# Patient Record
Sex: Male | Born: 1995 | Race: White | Hispanic: No | Marital: Single | Smoking: Never smoker
Health system: Southern US, Community
[De-identification: ages and names within clinical notes are randomized; demographics above are authoritative.]

## PROBLEM LIST (undated history)

## (undated) HISTORY — PX: KNEE SURGERY: SHX244

## (undated) HISTORY — PX: TYMPANOSTOMY TUBE PLACEMENT: SHX32

## (undated) HISTORY — PX: DENTAL SURGERY: SHX609

## (undated) HISTORY — PX: HERNIA REPAIR: SHX51

## (undated) HISTORY — PX: ANKLE SURGERY: SHX546

---
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
## 2017-01-30 ENCOUNTER — Emergency Department (HOSPITAL_COMMUNITY): Payer: No Typology Code available for payment source

## 2017-01-30 ENCOUNTER — Encounter (HOSPITAL_COMMUNITY): Payer: Self-pay

## 2017-01-30 ENCOUNTER — Emergency Department (HOSPITAL_COMMUNITY)
Admission: EM | Admit: 2017-01-30 | Discharge: 2017-01-30 | Disposition: A | Discharge status: Home / Self Care | Payer: No Typology Code available for payment source | Attending: Emergency Medicine | Admitting: Emergency Medicine

## 2017-01-30 DIAGNOSIS — Z91040 Latex allergy status: Secondary | ICD-10-CM | POA: Diagnosis not present

## 2017-01-30 DIAGNOSIS — Y929 Unspecified place or not applicable: Secondary | ICD-10-CM | POA: Diagnosis not present

## 2017-01-30 DIAGNOSIS — S060X1A Concussion with loss of consciousness of 30 minutes or less, initial encounter: Secondary | ICD-10-CM | POA: Insufficient documentation

## 2017-01-30 DIAGNOSIS — R1084 Generalized abdominal pain: Secondary | ICD-10-CM | POA: Diagnosis not present

## 2017-01-30 DIAGNOSIS — Z79899 Other long term (current) drug therapy: Secondary | ICD-10-CM | POA: Insufficient documentation

## 2017-01-30 DIAGNOSIS — Y9389 Activity, other specified: Secondary | ICD-10-CM | POA: Diagnosis not present

## 2017-01-30 DIAGNOSIS — H5702 Anisocoria: Secondary | ICD-10-CM | POA: Diagnosis not present

## 2017-01-30 DIAGNOSIS — R112 Nausea with vomiting, unspecified: Secondary | ICD-10-CM | POA: Diagnosis present

## 2017-01-30 DIAGNOSIS — Z791 Long term (current) use of non-steroidal anti-inflammatories (NSAID): Secondary | ICD-10-CM | POA: Insufficient documentation

## 2017-01-30 DIAGNOSIS — Y999 Unspecified external cause status: Secondary | ICD-10-CM | POA: Diagnosis not present

## 2017-01-30 LAB — CBC
HEMATOCRIT: 41.1 % (ref 39.0–52.0)
Hemoglobin: 14.1 g/dL (ref 13.0–17.0)
MCH: 31.5 pg (ref 26.0–34.0)
MCHC: 34.3 g/dL (ref 30.0–36.0)
MCV: 91.9 fL (ref 78.0–100.0)
PLATELETS: 188 10*3/uL (ref 150–400)
RBC: 4.47 MIL/uL (ref 4.22–5.81)
RDW: 12.6 % (ref 11.5–15.5)
WBC: 5.3 10*3/uL (ref 4.0–10.5)

## 2017-01-30 LAB — COMPREHENSIVE METABOLIC PANEL
ALBUMIN: 4.1 g/dL (ref 3.5–5.0)
ALT: 16 U/L — AB (ref 17–63)
AST: 23 U/L (ref 15–41)
Alkaline Phosphatase: 81 U/L (ref 38–126)
Anion gap: 6 (ref 5–15)
BUN: 13 mg/dL (ref 6–20)
CHLORIDE: 106 mmol/L (ref 101–111)
CO2: 28 mmol/L (ref 22–32)
CREATININE: 1.09 mg/dL (ref 0.61–1.24)
Calcium: 9.6 mg/dL (ref 8.9–10.3)
GFR calc Af Amer: >60 mL/min (ref >60)
GFR calc non Af Amer: >60 mL/min (ref >60)
Glucose, Bld: 71 mg/dL (ref 65–99)
Potassium: 4.2 mmol/L (ref 3.5–5.1)
SODIUM: 140 mmol/L (ref 135–145)
Total Bilirubin: 3.5 mg/dL — ABNORMAL HIGH (ref 0.3–1.2)
Total Protein: 6.6 g/dL (ref 6.5–8.1)

## 2017-01-30 LAB — TYPE AND SCREEN
ABO/RH(D): A POS
Antibody Screen: NEGATIVE

## 2017-01-30 LAB — I-STAT CG4 LACTIC ACID, ED: Lactic Acid, Venous: 1.94 mmol/L — ABNORMAL HIGH (ref 0.5–1.9)

## 2017-01-30 LAB — ABO/RH: ABO/RH(D): A POS

## 2017-01-30 LAB — LIPASE, BLOOD: LIPASE: 19 U/L (ref 11–51)

## 2017-01-30 MED ORDER — IOPAMIDOL (ISOVUE-300) INJECTION 61%
INTRAVENOUS | Status: AC
  Administered 2017-01-30: 100 mL
  Filled 2017-01-30: qty 100

## 2017-01-30 MED ORDER — MORPHINE SULFATE (PF) 4 MG/ML IV SOLN
4.0000 mg | Freq: Once | INTRAVENOUS | Status: AC
  Administered 2017-01-30: 4 mg via INTRAVENOUS
  Filled 2017-01-30: qty 1

## 2017-01-30 MED ORDER — PROMETHAZINE HCL 25 MG PO TABS: 25.0000 mg | ORAL_TABLET | Freq: Four times a day (QID) | ORAL | 0 refills | Status: DC | PRN

## 2017-01-30 MED ORDER — FENTANYL CITRATE (PF) 100 MCG/2ML IJ SOLN
100.0000 ug | Freq: Once | INTRAMUSCULAR | Status: AC
  Administered 2017-01-30: 100 ug via INTRAVENOUS
  Filled 2017-01-30: qty 2

## 2017-01-30 MED ORDER — ONDANSETRON HCL 4 MG/2ML IJ SOLN
4.0000 mg | Freq: Once | INTRAMUSCULAR | Status: AC
  Administered 2017-01-30: 4 mg via INTRAVENOUS
  Filled 2017-01-30: qty 2

## 2017-01-30 MED ORDER — SODIUM CHLORIDE 0.9 % IV BOLUS (SEPSIS)
1000.0000 mL | Freq: Once | INTRAVENOUS | Status: AC
  Administered 2017-01-30: 1000 mL via INTRAVENOUS

## 2017-01-30 MED ORDER — KETOROLAC TROMETHAMINE 30 MG/ML IJ SOLN
30.0000 mg | Freq: Once | INTRAMUSCULAR | Status: AC
  Administered 2017-01-30: 30 mg via INTRAVENOUS
  Filled 2017-01-30: qty 1

## 2017-01-30 NOTE — ED Notes (Signed)
Pt had one episode of emesis while in the waiting room. Pupils unequal. Dr. Clarene DukeLittle informed and pt taken to trauma A for assessment.

## 2017-01-30 NOTE — ED Notes (Signed)
ED Provider at bedside. 

## 2017-01-30 NOTE — ED Notes (Signed)
Patient transported to CT 
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
## 2017-01-30 NOTE — ED Notes (Signed)
Fast exam negative by edp

## 2017-01-30 NOTE — Discharge Instructions (Signed)
Avoid screen time and have the reading until your symptoms resolved. Avoid contact sports. Take Tylenol and/or ibuprofen as needed for headaches. Return to ER if you have repeated vomiting, confusion, balance problems, extremity weakness, or sudden changes in mental status.
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
## 2018-07-21 ENCOUNTER — Other Ambulatory Visit: Payer: Self-pay

## 2018-07-21 ENCOUNTER — Encounter (HOSPITAL_COMMUNITY): Payer: Self-pay | Admitting: *Deleted

## 2018-07-21 ENCOUNTER — Emergency Department (HOSPITAL_COMMUNITY)
Admission: EM | Admit: 2018-07-21 | Discharge: 2018-07-21 | Disposition: A | Discharge status: Home / Self Care | Payer: No Typology Code available for payment source | Attending: Emergency Medicine | Admitting: Emergency Medicine

## 2018-07-21 DIAGNOSIS — F10929 Alcohol use, unspecified with intoxication, unspecified: Secondary | ICD-10-CM | POA: Insufficient documentation

## 2018-07-21 DIAGNOSIS — F10920 Alcohol use, unspecified with intoxication, uncomplicated: Secondary | ICD-10-CM

## 2018-07-21 DIAGNOSIS — Z91040 Latex allergy status: Secondary | ICD-10-CM | POA: Insufficient documentation
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
## 2018-07-21 NOTE — ED Notes (Signed)
Bed: [facility] Expected date:  Expected time:  Means of arrival:  Comments: 
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
## 2018-07-21 NOTE — ED Notes (Signed)
Pt ambulate down the hall and back to the hall bed done well

## 2018-07-21 NOTE — ED Notes (Signed)
Pt ambulated to restroom without difficulty

## 2018-07-21 NOTE — ED Notes (Signed)
Patient given ginger ale. Pt does not want anything to eat at this time.

## 2018-07-21 NOTE — Discharge Instructions (Signed)
You have been seen today for alcohol intoxication. Please read and follow all provided instructions.   1. Medications: usual home medications 2. Treatment: rest, drink plenty of fluids 3. Follow Up: Please follow up with your primary doctor in 2 days for discussion of your diagnoses and further evaluation after today's visit; if you do not have a primary care doctor use the resource guide provided to find one; Please return to the ER for any new or worsening symptoms. Please obtain all of your results from medical records or have your doctors office obtain the results - share them with your doctor - you should be seen at your doctors office. Call today to arrange your follow up.   Take medications as prescribed. Please review all of the medicines and only take them if you do not have an allergy to them. Return to the emergency room for worsening condition or new concerning symptoms. Follow up with your regular doctor. If you don't have a regular doctor use one of the numbers below to establish a primary care doctor.  Please be aware that if you are taking birth control pills, taking other prescriptions, ESPECIALLY ANTIBIOTICS may make the birth control ineffective - if this is the case, either do not engage in sexual activity or use alternative methods of birth control such as condoms until you have finished the medicine and your family doctor says it is OK to restart them. If you are on a blood thinner such as COUMADIN, be aware that any other medicine that you take may cause the coumadin to either work too much, or not enough - you should have your coumadin level rechecked in next 7 days if this is the case.  ?  It is also a possibility that you have an allergic reaction to any of the medicines that you have been prescribed - Everybody reacts differently to medications and while MOST people have no trouble with most medicines, you may have a reaction such as nausea, vomiting, rash, swelling, shortness of  breath. If this is the case, please stop taking the medicine immediately and contact your physician.  ?  You should return to the ER if you develop severe or worsening symptoms.   Emergency Department Resource Guide 1) Find a Doctor and Pay Out of Pocket Although you won't have to find out who is covered by your insurance plan, it is a good idea to ask around and get recommendations. You will then need to call the office and see if the doctor you have chosen will accept you as a new patient and what types of options they offer for patients who are self-pay. Some doctors offer discounts or will set up payment plans for their patients who do not have insurance, but you will need to ask so you aren't surprised when you get to your appointment.  2) Contact Your Local Health Department Not all health departments have doctors that can see patients for sick visits, but many do, so it is worth a call to see if yours does. If you don't know where your local health department is, you can check in your phone book. The CDC also has a tool to help you locate your state's health department, and many state websites also have listings of all of their local health departments.  3) Find a Walk-in Clinic If your illness is not likely to be very severe or complicated, you may want to try a walk in clinic. These are popping up all over  the country in pharmacies, drugstores, and shopping centers. They're usually staffed by nurse practitioners or physician assistants that have been trained to treat common illnesses and complaints. They're usually fairly quick and inexpensive. However, if you have serious medical issues or chronic medical problems, these are probably not your best option.  No Primary Care Doctor: Call [facility] at  [phone] - they can help you locate a primary care doctor that  accepts your insurance, provides certain services, etc. Physician Referral [phone]  Emergency Department  Resource Guide 1) Find a Doctor and Pay Out of Pocket Although you won't have to find out who is covered by your insurance plan, it is a good idea to ask around and get recommendations. You will then need to call the office and see if the doctor you have chosen will accept you as a new patient and what types of options they offer for patients who are self-pay. Some doctors offer discounts or will set up payment plans for their patients who do not have insurance, but you will need to ask so you aren't surprised when you get to your appointment.  2) Contact Your Local Health Department Not all health departments have doctors that can see patients for sick visits, but many do, so it is worth a call to see if yours does. If you don't know where your local health department is, you can check in your phone book. The CDC also has a tool to help you locate your state's health department, and many state websites also have listings of all of their local health departments.  3) Find a Walk-in Clinic If your illness is not likely to be very severe or complicated, you may want to try a walk in clinic. These are popping up all over the country in pharmacies, drugstores, and shopping centers. They're usually staffed by nurse practitioners or physician assistants that have been trained to treat common illnesses and complaints. They're usually fairly quick and inexpensive. However, if you have serious medical issues or chronic medical problems, these are probably not your best option.  No Primary Care Doctor: Call [facility] at  [phone] - they can help you locate a primary care doctor that  accepts your insurance, provides certain services, etc. Physician Referral Service- [phone]  Chronic Pain Problems: Organization         Address  Phone   Notes  [facility]  [phone] Patients need to be referred by their primary care doctor.   Medication Assistance: Organization          Address  Phone   Notes  [facility] Medication [facility] [address] [place], [place] [address] [phone] --Must be a resident of [facility] -- Must have NO insurance coverage whatsoever (no Medicaid/ Medicare, etc.) -- The pt. MUST have a primary care doctor that directs their care regularly and follows them in the community   [facility]  [phone]   [employer]  [phone]    Agencies that provide inexpensive medical care: Organization         Address  Phone   Notes  [facility]  [phone]   [facility]    [phone]   [facility] [address] [place], [place] [address] [phone]   [facility] [address] [place], [place] [phone]   [facility]    [phone]   [facility]    [phone]   °[facility] ° [address], [place] Phone:  [phone], Fax:  [phone] Hours of Operation:  9 am - 6 pm, M-F.  Also accepts Medicaid/Medicare and self-pay.  °[facility] ° [address], [place] Phone: [phone], Fax: [phone]. Hours of Operation:  8:30 am - 5:30 pm, M-F.  Also accepts Medicaid and self-pay.  °[facility] [place] [address], [place] Phone: [phone]   °[facility] [address], [place], [place] [phone], Ext. [phone] Mondays & Thursdays: 7-9 AM.  First 15 patients are seen on a first come, first serve basis. °  ° °Medicaid-accepting [place] Providers: ° °Organization         Address  Phone   Notes  °[facility] [address], [place] [phone] Also accepts self-pay patients.  °[facility] [address], [place] ° [phone]   °[facility] [address], [place] [phone]   °[facility] [address] [place] [address], [place] [phone]   °[facility] [address], [place]  ° [phone] Only accepts [place] Access Medicaid patients after they have their name applied to their card.  ° °Self-Pay (no insurance) in [place]: ° °Organization         Address  Phone   Notes  °Sickle Cell Patients, [facility] [address], [place] [phone]   °[facility] [address], [place] [phone]   °[facility] ° [address],  [phone]   °[facility]/Dr. Osei-Bonsu ° [address] [place] [address], [place] or [address], [place] [phone] Phone number for both [place] and [place] locations is the same.  °[facility] [address], [place] [phone]   °[facility]  [address] [place] [address],  or [address] [phone] °([phone]) [phone]   °[facility] [address],  [phone], phone; [phone], fax Sees patients 1st and 3rd Saturday of every month.  Must not qualify for public or private insurance (i.e. Medicaid, Medicare, [place] Health Choice, Veterans' Benefits)  Household income should be no more than 200% of the poverty level The clinic cannot treat you if you are pregnant or think you are pregnant  Sexually transmitted diseases are not treated at the clinic.  °  ° °

## 2021-12-10 DIAGNOSIS — B354 Tinea corporis: Secondary | ICD-10-CM | POA: Diagnosis not present

## 2021-12-10 DIAGNOSIS — B356 Tinea cruris: Secondary | ICD-10-CM | POA: Diagnosis not present

## 2021-12-10 DIAGNOSIS — R2242 Localized swelling, mass and lump, left lower limb: Secondary | ICD-10-CM | POA: Diagnosis not present

## 2022-01-06 DIAGNOSIS — I8392 Asymptomatic varicose veins of left lower extremity: Secondary | ICD-10-CM | POA: Diagnosis not present

## 2022-01-06 DIAGNOSIS — R2242 Localized swelling, mass and lump, left lower limb: Secondary | ICD-10-CM | POA: Diagnosis not present

## 2022-01-13 DIAGNOSIS — L700 Acne vulgaris: Secondary | ICD-10-CM | POA: Diagnosis not present

## 2022-01-13 DIAGNOSIS — L739 Follicular disorder, unspecified: Secondary | ICD-10-CM | POA: Diagnosis not present

## 2022-01-13 DIAGNOSIS — B356 Tinea cruris: Secondary | ICD-10-CM | POA: Diagnosis not present

## 2022-05-29 DIAGNOSIS — L02233 Carbuncle of chest wall: Secondary | ICD-10-CM | POA: Diagnosis not present

## 2022-05-29 DIAGNOSIS — B349 Viral infection, unspecified: Secondary | ICD-10-CM | POA: Diagnosis not present

## 2022-06-23 ENCOUNTER — Ambulatory Visit: Payer: No Typology Code available for payment source | Admitting: Student

## 2022-06-30 ENCOUNTER — Ambulatory Visit (INDEPENDENT_AMBULATORY_CARE_PROVIDER_SITE_OTHER): Payer: BC Managed Care – PPO

## 2022-06-30 VITALS — BP 130/85 | HR 62 | Temp 97.8°F | Ht 69.0 in | Wt 177.0 lb

## 2022-06-30 DIAGNOSIS — Z0000 Encounter for general adult medical examination without abnormal findings: Secondary | ICD-10-CM | POA: Diagnosis not present

## 2022-06-30 NOTE — Progress Notes (Signed)
New Patient Office Visit  Subjective    Patient ID: Srinivas Lippman, male    DOB: 1995/09/07  Age: 26 y.o. MRN: [id number]  CC:  Chief Complaint  Patient presents with   Annual Exam    HPI Samuell Knoble presents to establish care. 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 Reports leg length discrepancy also treated as child and then again as adult. Endorses varicose vein at left leg evaluated by ultrasound. Endorses acne and follows with dermatology. Denies any other health problems. Denies chest pain, shortness of breath, fevers, chills, vision problems, gait issues, abdominal pain, nausea, or vomiting.  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 Travels frequently. Graduated high school at [employer], [place]) but no further education. Reports 6 beers a week. Denies current tobacco use.  Denies any other substance use currently.  Endorses dating relationship with male. Sexually active and always uses condoms. No children. Exercises regularly, mainly lifting weights. Endorses avoidance of fast food and soft drinks. Endorses being fully vaccinated as a child but has not had boosters as adult.    No outpatient encounter medications on file as of 06/30/2022.   No facility-administered encounter medications on file as of 06/30/2022.    No past medical history on file.  Past Surgical History:  Procedure Laterality Date   ANKLE SURGERY Bilateral    DENTAL SURGERY     HERNIA REPAIR     KNEE SURGERY Right    TYMPANOSTOMY TUBE PLACEMENT      No family history on file.  Social History   Socioeconomic History   Marital status: Single    Spouse name: Not on file   Number of children: Not on file   Years of education: Not on file   Highest education level: Not on file  Occupational History   Not on file  Tobacco Use   Smoking status: Never   Smokeless tobacco: Never  Substance and Sexual Activity   Alcohol use:  No   Drug use: Not on file   Sexual activity: Not on file  Other Topics Concern   Not on file  Social History Narrative   Not on file   Social Determinants of Health   Financial Resource Strain: Low Risk  (06/30/2022)   Overall Financial Resource Strain (CARDIA)    Difficulty of Paying Living Expenses: Not hard at all  Food Insecurity: No Food Insecurity (06/30/2022)   Hunger Vital Sign    Worried About Running Out of Food in the Last Year: Never true    Ran Out of Food in the Last Year: Never true  Transportation Needs: No Transportation Needs (06/30/2022)   PRAPARE - Administrator, Civil Service (Medical): No    Lack of Transportation (Non-Medical): No  Physical Activity: Insufficiently Active (06/30/2022)   Exercise Vital Sign    Days of Exercise per Week: 3 days    Minutes of Exercise per Session: 40 min  Stress: No Stress Concern Present (06/30/2022)   [employer] of Occupational Health - Occupational Stress Questionnaire    Feeling of Stress : Not at all  Social Connections: Moderately Integrated (06/30/2022)   Social Connection and Isolation Panel [NHANES]    Frequency of Communication with Friends and Family: More than three times a week    Frequency of Social Gatherings with Friends and Family: More than three times a week    Attends Religious Services: 1 to 4 times per year  Active Member of Clubs or Organizations: Yes    Attends Banker Meetings: 1 to 4 times per year    Marital Status: Never married  Intimate Partner Violence: Not At Risk (06/30/2022)   Humiliation, Afraid, Rape, and Kick questionnaire    Fear of Current or Ex-Partner: No    Emotionally Abused: No    Physically Abused: No    Sexually Abused: No    ROS      Objective    BP 130/85 (BP Location: Left Arm, Patient Position: Sitting, Cuff Size: Normal)   Pulse 62   Temp 97.8 F (36.6 C) (Oral)   Ht 5\' 9"  (1.753 m)   Wt 177 lb (80.3 kg)   SpO2 100%   BMI  26.14 kg/m   Physical Exam HENT:     Head: Normocephalic and atraumatic.  Eyes:     Extraocular Movements: Extraocular movements intact.  Cardiovascular:     Rate and Rhythm: Normal rate and regular rhythm.  Pulmonary:     Effort: Pulmonary effort is normal.     Breath sounds: Normal breath sounds.  Skin:    General: Skin is warm and dry.  Neurological:     General: No focal deficit present.     Mental Status: He is alert and oriented to person, place, and time.  Psychiatric:        Mood and Affect: Mood normal.        Behavior: Behavior normal.         Assessment & Plan:   Problem List Items Addressed This Visit       Other   Encounter for annual general medical examination without abnormal findings in adult - Primary    Patient presents to establish care today. He has no major chronic diseases. Discussed mild hypertension today. He does not take blood pressure medication. Discussed lifestyle changes including avoidance of salt in diet as well as continuing with weightlifting and also increasing cardio. Discussed keeping alcohol use at 1-2 beverages daily which he already abides by. Discussed that we can refer to vein specialists if experiences discomfort in the future.       Return in about 1 year (around 07/01/2023) for annual visit.   07/03/2023, MD

## 2022-06-30 NOTE — Assessment & Plan Note (Signed)
Patient presents to establish care today. He has no major chronic diseases. Discussed mild hypertension today. He does not take blood pressure medication. Discussed lifestyle changes including avoidance of salt in diet as well as continuing with weightlifting and also increasing cardio. Discussed keeping alcohol use at 1-2 beverages daily which he already abides by. Discussed that we can refer to vein specialists if experiences discomfort in the future.

## 2022-06-30 NOTE — Patient Instructions (Signed)
[name], it was a pleasure seeing you today!  Today we discussed: Health maintenance items- keep to 1-2 alcoholic beverages a day, keep salt intake low (avoid soft drinks, fast food) Varicose vein- can refer to vascular/vein specialist if still bothersome at next visit  I have ordered the following labs today:  Lab Orders  No laboratory test(s) ordered today     Tests ordered today:  none  Referrals ordered today:   Referral Orders  No referral(s) requested today     I have ordered the following medication/changed the following medications:   Stop the following medications: There are no discontinued medications.   Start the following medications: No orders of the defined types were placed in this encounter.    Follow-up: 1 year   Please make sure to arrive 15 minutes prior to your next appointment. If you arrive late, you may be asked to reschedule.   We look forward to seeing you next time. Please call our clinic at [phone] if you have any questions or concerns. The best time to call is Monday-Friday from 9am-4pm, but there is someone available 24/7. If after hours or the weekend, call the main hospital number and ask for the Internal Medicine Resident On-Call. If you need medication refills, please notify your pharmacy one week in advance and they will send [place] a request.  Thank you for letting us take part in your care. Wishing you the best!  Thank you, Jose Bene, MD

## 2022-07-03 NOTE — Progress Notes (Signed)
Internal Medicine Clinic Attending  Case discussed with Dr. White  At the time of the visit.  We reviewed the resident's history and exam and pertinent patient test results.  I agree with the assessment, diagnosis, and plan of care documented in the resident's note.  

## 2022-11-14 DIAGNOSIS — B356 Tinea cruris: Secondary | ICD-10-CM | POA: Diagnosis not present

## 2022-11-14 DIAGNOSIS — L700 Acne vulgaris: Secondary | ICD-10-CM | POA: Diagnosis not present

## 2022-11-14 DIAGNOSIS — B353 Tinea pedis: Secondary | ICD-10-CM | POA: Diagnosis not present

## 2022-11-17 DIAGNOSIS — L700 Acne vulgaris: Secondary | ICD-10-CM | POA: Diagnosis not present

## 2022-11-17 DIAGNOSIS — Z79899 Other long term (current) drug therapy: Secondary | ICD-10-CM | POA: Diagnosis not present

## 2023-01-11 DIAGNOSIS — B353 Tinea pedis: Secondary | ICD-10-CM | POA: Diagnosis not present

## 2023-01-11 DIAGNOSIS — B356 Tinea cruris: Secondary | ICD-10-CM | POA: Diagnosis not present

## 2023-01-11 DIAGNOSIS — L700 Acne vulgaris: Secondary | ICD-10-CM | POA: Diagnosis not present

## 2023-02-09 DIAGNOSIS — Z79899 Other long term (current) drug therapy: Secondary | ICD-10-CM | POA: Diagnosis not present

## 2023-02-09 DIAGNOSIS — E785 Hyperlipidemia, unspecified: Secondary | ICD-10-CM | POA: Diagnosis not present

## 2023-02-09 DIAGNOSIS — L700 Acne vulgaris: Secondary | ICD-10-CM | POA: Diagnosis not present

## 2023-02-13 DIAGNOSIS — L700 Acne vulgaris: Secondary | ICD-10-CM | POA: Diagnosis not present

## 2023-03-22 DIAGNOSIS — L700 Acne vulgaris: Secondary | ICD-10-CM | POA: Diagnosis not present

## 2023-04-26 DIAGNOSIS — L700 Acne vulgaris: Secondary | ICD-10-CM | POA: Diagnosis not present

## 2023-05-31 DIAGNOSIS — L700 Acne vulgaris: Secondary | ICD-10-CM | POA: Diagnosis not present

## 2023-07-26 DIAGNOSIS — L700 Acne vulgaris: Secondary | ICD-10-CM | POA: Diagnosis not present

## 2023-07-26 DIAGNOSIS — L300 Nummular dermatitis: Secondary | ICD-10-CM | POA: Diagnosis not present

## 2023-07-27 DIAGNOSIS — Z0000 Encounter for general adult medical examination without abnormal findings: Secondary | ICD-10-CM | POA: Diagnosis not present

## 2023-07-27 DIAGNOSIS — Z79899 Other long term (current) drug therapy: Secondary | ICD-10-CM | POA: Diagnosis not present

## 2023-07-27 DIAGNOSIS — Z113 Encounter for screening for infections with a predominantly sexual mode of transmission: Secondary | ICD-10-CM | POA: Diagnosis not present

## 2023-07-27 DIAGNOSIS — Z1329 Encounter for screening for other suspected endocrine disorder: Secondary | ICD-10-CM | POA: Diagnosis not present

## 2024-02-25 ENCOUNTER — Telehealth: Payer: Self-pay | Admitting: *Deleted

## 2024-02-25 NOTE — Telephone Encounter (Signed)
 Pt overdue for an appt CMA attempted to contact pt with appt Pt prefers to call back once he checks his schedule.Jose Kidd Cassady8/11/20259:32 AM

## 2024-04-18 ENCOUNTER — Institutional Professional Consult (permissible substitution) (INDEPENDENT_AMBULATORY_CARE_PROVIDER_SITE_OTHER): Admitting: Otolaryngology

## 2024-05-16 ENCOUNTER — Institutional Professional Consult (permissible substitution) (INDEPENDENT_AMBULATORY_CARE_PROVIDER_SITE_OTHER): Admitting: Otolaryngology
